# Patient Record
Sex: Female | Born: 2000 | Race: Asian | Hispanic: No | Marital: Single | Smoking: Never smoker
Health system: Southern US, Community
[De-identification: ages and names within clinical notes are randomized; demographics above are authoritative.]

## PROBLEM LIST (undated history)

## (undated) DIAGNOSIS — H905 Unspecified sensorineural hearing loss: Secondary | ICD-10-CM

## (undated) DIAGNOSIS — R7611 Nonspecific reaction to tuberculin skin test without active tuberculosis: Secondary | ICD-10-CM

## (undated) HISTORY — DX: Nonspecific reaction to tuberculin skin test without active tuberculosis: R76.11

## (undated) HISTORY — DX: Unspecified sensorineural hearing loss: H90.5

---
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
## 2018-02-12 ENCOUNTER — Encounter: Payer: Self-pay | Admitting: Obstetrics & Gynecology

## 2018-06-20 DIAGNOSIS — H5213 Myopia, bilateral: Secondary | ICD-10-CM | POA: Diagnosis not present

## 2018-09-19 ENCOUNTER — Telehealth: Payer: Self-pay

## 2018-09-19 NOTE — Telephone Encounter (Signed)
Copied from CRM [id number]. Topic: Appointment Scheduling - Scheduling Inquiry for Clinic >> Sep 19, 2018 10:55 AM Herby Abraham C wrote: Reason for CRM: current pt Toni Ryan, Jeffery would like to know if PCP Abner Greenspan would take daughter on as np?

## 2018-09-19 NOTE — Telephone Encounter (Signed)
Please advise 

## 2018-09-21 NOTE — Telephone Encounter (Signed)
Yes willing to take family member unable to take a new patient in January

## 2018-09-23 NOTE — Telephone Encounter (Signed)
Okay to schedule NP appt.  

## 2018-09-24 NOTE — Telephone Encounter (Signed)
Appt scheduled 12/01/2018.  

## 2018-12-01 ENCOUNTER — Ambulatory Visit (INDEPENDENT_AMBULATORY_CARE_PROVIDER_SITE_OTHER): Payer: No Typology Code available for payment source | Admitting: Family Medicine

## 2018-12-01 ENCOUNTER — Encounter: Payer: Self-pay | Admitting: Family Medicine

## 2018-12-01 ENCOUNTER — Other Ambulatory Visit: Payer: Self-pay

## 2018-12-01 VITALS — BP 110/74 | HR 77 | Temp 98.0°F | Resp 18 | Ht 61.0 in | Wt 158.4 lb

## 2018-12-01 DIAGNOSIS — M79672 Pain in left foot: Secondary | ICD-10-CM | POA: Diagnosis not present

## 2018-12-01 DIAGNOSIS — Z23 Encounter for immunization: Secondary | ICD-10-CM

## 2018-12-01 DIAGNOSIS — Z0000 Encounter for general adult medical examination without abnormal findings: Secondary | ICD-10-CM

## 2018-12-01 NOTE — Progress Notes (Signed)
Subjective:    Patient ID: Toni Ryan, female    DOB: 2001/02/14, 18 y.o.   MRN: [id number]  Chief Complaint  Patient presents with  . New Patient (Initial Visit)    Pt states wanting a annual check up and states doesn't need a pap smear.    HPI Patient is in today for new patient annual preventative exam. She feels well but needs an adult doctor. She is a Presenter, broadcasting at [employer]. She is noting some swelling and pain with palpation on the top of her left foot. No injury. No redness or warmth. Denies CP/palp/SOB/HA/congestion/fevers/GI or GU c/o. Taking meds as prescribed. She tries to maintain a heart helathy diet.   Past Medical History:  Diagnosis Date  . 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 No pertinent surgical history.  Family History  Problem Relation Age of Onset  . Hypertension Mother   . Cancer Maternal Grandmother   . Hyperlipidemia Maternal Grandmother   . Hyperlipidemia Paternal Grandmother   . Cancer Paternal Grandfather   . Allergies Father     Social History   Socioeconomic History  . Marital status: Single    Spouse name: Not on file  . Number of children: Not on file  . Years of education: Not on file  . Highest education level: Not on file  Occupational History  . Not on file  Social Needs  . Financial resource strain: Not on file  . Food insecurity:    Worry: Not on file    Inability: Not on file  . Transportation needs:    Medical: Not on file    Non-medical: Not on file  Tobacco Use  . Smoking status: Never Smoker  . Smokeless tobacco: Never Used  Substance and Sexual Activity  . Alcohol use: Never    Frequency: Never  . Drug use: Never  . Sexual activity: Not on file  Lifestyle  . Physical activity:    Days per week: Not on file    Minutes per session: Not on file  . Stress: Not on file  Relationships  . Social connections:    Talks on phone: Not on file    Gets together: Not on file   Attends religious service: Not on file    Active member of club or organization: Not on file    Attends meetings of clubs or organizations: Not on file    Relationship status: Not on file  . Intimate partner violence:    Fear of current or ex partner: Not on file    Emotionally abused: Not on file    Physically abused: Not on file    Forced sexual activity: Not on file  Other Topics Concern  . Not on file  Social History Narrative   Lives with parents, in school for nursing at [employer]. Works part Oncologist, no dietary restrictions, wears seat belt   Neg ETOH/tobacco/drug. 2 dogs. No dietary restrictions. Stays active    Outpatient Medications Prior to Visit  Medication Sig Dispense Refill  . Multiple Vitamin (MULTIVITAMIN) capsule Take by mouth.     No facility-administered medications prior to visit.     Not on File  Review of Systems  Constitutional: Negative for chills, fever and malaise/fatigue.  HENT: Negative for congestion and hearing loss.   Eyes: Negative for discharge.  Respiratory: Negative for cough, sputum production and shortness of breath.   Cardiovascular: Negative for chest pain, palpitations and leg  swelling.  Gastrointestinal: Negative for abdominal pain, blood in stool, constipation, diarrhea, heartburn, nausea and vomiting.  Genitourinary: Negative for dysuria, frequency, hematuria and urgency.  Musculoskeletal: Positive for joint pain. Negative for back pain, falls and myalgias.  Skin: Negative for rash.  Neurological: Negative for dizziness, sensory change, loss of consciousness, weakness and headaches.  Endo/Heme/Allergies: Negative for environmental allergies. Does not bruise/bleed easily.  Psychiatric/Behavioral: Negative for depression and suicidal ideas. The patient is not nervous/anxious and does not have insomnia.        Objective:    Physical Exam Constitutional:      General: She is not in acute distress.    Appearance: She is not  diaphoretic.  HENT:     Head: Normocephalic and atraumatic.     Right Ear: External ear normal.     Left Ear: External ear normal.     Nose: Nose normal.     Mouth/Throat:     Pharynx: No oropharyngeal exudate.  Eyes:     General: No scleral icterus.       Right eye: No discharge.        Left eye: No discharge.     Conjunctiva/sclera: Conjunctivae normal.     Pupils: Pupils are equal, round, and reactive to light.  Neck:     Musculoskeletal: Normal range of motion and neck supple.     Thyroid: No thyromegaly.  Cardiovascular:     Rate and Rhythm: Normal rate and regular rhythm.     Heart sounds: Normal heart sounds. No murmur.  Pulmonary:     Effort: Pulmonary effort is normal. No respiratory distress.     Breath sounds: Normal breath sounds. No wheezing or rales.  Abdominal:     General: Bowel sounds are normal. There is no distension.     Palpations: Abdomen is soft. There is no mass.     Tenderness: There is no abdominal tenderness.  Musculoskeletal: Normal range of motion.        General: No tenderness.  Lymphadenopathy:     Cervical: No cervical adenopathy.  Skin:    General: Skin is warm and dry.     Findings: No rash.  Neurological:     Mental Status: She is alert and oriented to person, place, and time.     Cranial Nerves: No cranial nerve deficit.     Coordination: Coordination normal.     Deep Tendon Reflexes: Reflexes are normal and symmetric. Reflexes normal.     BP 110/74 (BP Location: Left Arm, Patient Position: Sitting, Cuff Size: Normal)   Pulse 77   Temp 98 F (36.7 C) (Oral)   Resp 18   Ht '5\' 1"'$  (1.549 m)   Wt 158 lb 6.4 oz (71.8 kg)   LMP 11/03/2018   SpO2 98%   BMI 29.93 kg/m  Wt Readings from Last 3 Encounters:  12/01/18 158 lb 6.4 oz (71.8 kg) (86 %, Z= 1.09)*   * Growth percentiles are based on CDC (Girls, 2-20 Years) data.     No results found for: WBC, HGB, HCT, PLT, GLUCOSE, CHOL, TRIG, HDL, LDLDIRECT, LDLCALC, ALT, AST, NA, K, CL,  CREATININE, BUN, CO2, TSH, PSA, INR, GLUF, HGBA1C, MICROALBUR  No results found for: TSH No results found for: WBC, HGB, HCT, MCV, PLT No results found for: NA, K, CHLORIDE, CO2, GLUCOSE, BUN, CREATININE, BILITOT, ALKPHOS, AST, ALT, PROT, ALBUMIN, CALCIUM, ANIONGAP, EGFR, GFR No results found for: CHOL No results found for: HDL No results found for: LDLCALC No results  found for: TRIG No results found for: CHOLHDL No results found for: HGBA1C     Assessment & Plan:   Problem List Items Addressed This Visit    Preventative health care    Patient encouraged to maintain heart healthy diet, regular exercise, adequate sleep. Consider daily probiotics. Take medications as prescribed      Foot pain, left    Probably a ganglion cyst on left foot. Encouraged massage with Lidocaine topically. Xray ordered      Relevant Orders   DG Foot Complete Left    Other Visit Diagnoses    Need for influenza vaccination    -  Primary   Relevant Orders   Flu Vaccine QUAD 6+ mos PF IM (Fluarix Quad PF) (Completed)      I am having Toni Ryan maintain her multivitamin.  No orders of the defined types were placed in this encounter.    Penni Homans, MD

## 2018-12-01 NOTE — Assessment & Plan Note (Signed)
Probably a ganglion cyst on left foot. Encouraged massage with Lidocaine topically. Xray ordered

## 2018-12-01 NOTE — Patient Instructions (Addendum)
Lidocaine gel to foot daily  Preventive Care 18-39 Years, Female Preventive care refers to lifestyle choices and visits with your health care provider that can promote health and wellness. What does preventive care include?   A yearly physical exam. This is also called an annual well check.  Dental exams once or twice a year.  Routine eye exams. Ask your health care provider how often you should have your eyes checked.  Personal lifestyle choices, including: ? Daily care of your teeth and gums. ? Regular physical activity. ? Eating a healthy diet. ? Avoiding tobacco and drug use. ? Limiting alcohol use. ? Practicing safe sex. ? Taking vitamin and mineral supplements as recommended by your health care provider. What happens during an annual well check? The services and screenings done by your health care provider during your annual well check will depend on your age, overall health, lifestyle risk factors, and family history of disease. Counseling Your health care provider may ask you questions about your:  Alcohol use.  Tobacco use.  Drug use.  Emotional well-being.  Home and relationship well-being.  Sexual activity.  Eating habits.  Work and work Statistician.  Method of birth control.  Menstrual cycle.  Pregnancy history. Screening You may have the following tests or measurements:  Height, weight, and BMI.  Diabetes screening. This is done by checking your blood sugar (glucose) after you have not eaten for a while (fasting).  Blood pressure.  Lipid and cholesterol levels. These may be checked every 5 years starting at age 69.  Skin check.  Hepatitis C blood test.  Hepatitis B blood test.  Sexually transmitted disease (STD) testing.  BRCA-related cancer screening. This may be done if you have a family history of breast, ovarian, tubal, or peritoneal cancers.  Pelvic exam and Pap test. This may be done every 3 years starting at age 51. Starting at  age 22, this may be done every 5 years if you have a Pap test in combination with an HPV test. Discuss your test results, treatment options, and if necessary, the need for more tests with your health care provider. Vaccines Your health care provider may recommend certain vaccines, such as:  Influenza vaccine. This is recommended every year.  Tetanus, diphtheria, and acellular pertussis (Tdap, Td) vaccine. You may need a Td booster every 10 years.  Varicella vaccine. You may need this if you have not been vaccinated.  HPV vaccine. If you are 2 or younger, you may need three doses over 6 months.  Measles, mumps, and rubella (MMR) vaccine. You may need at least one dose of MMR. You may also need a second dose.  Pneumococcal 13-valent conjugate (PCV13) vaccine. You may need this if you have certain conditions and were not previously vaccinated.  Pneumococcal polysaccharide (PPSV23) vaccine. You may need one or two doses if you smoke cigarettes or if you have certain conditions.  Meningococcal vaccine. One dose is recommended if you are age 29-21 years and a first-year college student living in a residence hall, or if you have one of several medical conditions. You may also need additional booster doses.  Hepatitis A vaccine. You may need this if you have certain conditions or if you travel or work in places where you may be exposed to hepatitis A.  Hepatitis B vaccine. You may need this if you have certain conditions or if you travel or work in places where you may be exposed to hepatitis B.  Haemophilus influenzae type b (Hib) vaccine.  You may need this if you have certain risk factors. Talk to your health care provider about which screenings and vaccines you need and how often you need them. This information is not intended to replace advice given to you by your health care provider. Make sure you discuss any questions you have with your health care provider. Document Released: 10/30/2001  Document Revised: 04/16/2017 Document Reviewed: 07/05/2015 Elsevier Interactive Patient Education  2019 [employer].

## 2018-12-01 NOTE — Assessment & Plan Note (Signed)
Patient encouraged to maintain heart healthy diet, regular exercise, adequate sleep. Consider daily probiotics. Take medications as prescribed 

## 2019-11-16 ENCOUNTER — Ambulatory Visit: Payer: No Typology Code available for payment source | Attending: Internal Medicine

## 2019-11-16 DIAGNOSIS — Z20822 Contact with and (suspected) exposure to covid-19: Secondary | ICD-10-CM

## 2019-11-18 LAB — NOVEL CORONAVIRUS, NAA: SARS-CoV-2, NAA: NOT DETECTED

## 2020-07-01 ENCOUNTER — Other Ambulatory Visit (HOSPITAL_BASED_OUTPATIENT_CLINIC_OR_DEPARTMENT_OTHER): Payer: Self-pay | Admitting: Internal Medicine

## 2020-09-23 ENCOUNTER — Ambulatory Visit: Payer: Self-pay | Attending: Internal Medicine

## 2020-09-23 ENCOUNTER — Other Ambulatory Visit (HOSPITAL_BASED_OUTPATIENT_CLINIC_OR_DEPARTMENT_OTHER): Payer: Self-pay | Admitting: Internal Medicine

## 2020-09-23 DIAGNOSIS — Z23 Encounter for immunization: Secondary | ICD-10-CM

## 2020-09-23 NOTE — Progress Notes (Signed)
   Covid-19 Vaccination Clinic  Name:  Toni Ryan    MRN: [id number] DOB: [birth year]  09/23/2020  Toni Ryan was observed post Covid-19 immunization for 15 minutes without incident. She was provided with Vaccine Information Sheet and instruction to access the V-Safe system.   Toni Ryan was instructed to call 911 with any severe reactions post vaccine: Marland Kitchen Difficulty breathing  . Swelling of face and throat  . A fast heartbeat  . A bad rash all over body  . Dizziness and weakness   Immunizations Administered    Name Date Dose VIS Date Route   Moderna Covid-19 Booster Vaccine 09/23/2020 10:19 AM 0.25 mL 07/06/2020 Intramuscular   Manufacturer: Gala Murdoch   Lot: [id number]   NDC: 32992-426-83

## 2021-07-25 ENCOUNTER — Ambulatory Visit
Admission: RE | Admit: 2021-07-25 | Discharge: 2021-07-25 | Disposition: A | Discharge status: Home / Self Care | Payer: 59 | Source: Ambulatory Visit | Attending: Emergency Medicine | Admitting: Emergency Medicine

## 2021-07-25 ENCOUNTER — Other Ambulatory Visit: Payer: Self-pay

## 2021-07-25 VITALS — BP 119/76 | HR 92 | Temp 98.5°F | Resp 18 | Wt 154.0 lb

## 2021-07-25 DIAGNOSIS — J101 Influenza due to other identified influenza virus with other respiratory manifestations: Secondary | ICD-10-CM | POA: Diagnosis not present

## 2021-07-25 DIAGNOSIS — J069 Acute upper respiratory infection, unspecified: Secondary | ICD-10-CM | POA: Diagnosis not present

## 2021-07-25 DIAGNOSIS — J039 Acute tonsillitis, unspecified: Secondary | ICD-10-CM | POA: Diagnosis not present

## 2021-07-25 DIAGNOSIS — R059 Cough, unspecified: Secondary | ICD-10-CM | POA: Diagnosis not present

## 2021-07-25 DIAGNOSIS — J029 Acute pharyngitis, unspecified: Secondary | ICD-10-CM | POA: Diagnosis not present

## 2021-07-25 NOTE — ED Provider Notes (Signed)
[facility]    CSN: [id number] Arrival date & time: 07/25/21  1235      History   Chief Complaint Chief Complaint  Patient presents with   Cough    HPI Toni Ryan is a 20 y.o. female.   Pt states she has had cough and sore throat and ear pressure since last Wednesday. States her little sister was dx with flu about a week ago, came home to visit from school and has since gone back to school now feeling much better.  Patient states she is unaware of whether or not she has been having a fever.  Patient states that she is having pain with swallowing and does have a globus sensation when she does so.  Patient states has not tried any medications for symptoms at this time.  States she is really here because her mother told her to come in to be evaluated.  Patient states she feels like she is actually getting better at this point.  The history is provided by the patient.   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 No pertinent surgical history.  OB History   No obstetric history on file.      Home Medications    Prior to Admission medications   Medication Sig Start Date End Date Taking? Authorizing Provider  COVID-19 mRNA vaccine, Moderna, 100 MCG/0.5ML injection INJECT AS DIRECTED 09/23/20 09/23/21  Judyann Munson, MD  Multiple Vitamin (MULTIVITAMIN) capsule Take by mouth.    [provider]    Family History Family History  Problem Relation Age of Onset   Hypertension Mother    Cancer Maternal Grandmother    Hyperlipidemia Maternal Grandmother    Hyperlipidemia Paternal Grandmother    Cancer Paternal Grandfather    Allergies Father     Social History Social History   Tobacco Use   Smoking status: Never   Smokeless tobacco: Never  Vaping Use    Vaping Use: Never used  Substance Use Topics   Alcohol use: Never   Drug use: Never     Allergies   Patient has no allergy information on record.   Review of Systems Review of Systems Pertinent findings noted in history of present illness.    Physical Exam Triage Vital Signs ED Triage Vitals  Enc Vitals Group     BP 07/14/21 0827 (!) 147/82     Pulse Rate 07/14/21 0827 72     Resp 07/14/21 0827 18     Temp 07/14/21 0827 98.3 F (36.8 C)     Temp Source 07/14/21 0827 Oral     SpO2 07/14/21 0827 98 %     Weight --      Height --      Head Circumference --      Peak Flow --      Pain Score 07/14/21 0826 5     Pain Loc --      Pain Edu? --      Excl. in GC? --    No data found.  Updated Vital Signs BP 119/76 (BP Location: Right Arm)   Pulse 92   Temp 98.5 F (36.9 C) (Oral)   Resp 18   Wt 154 lb (69.9 kg)   LMP 07/12/2021 (Approximate)   SpO2  98%   BMI 29.10 kg/m   Visual Acuity Right Eye Distance:   Left Eye Distance:   Bilateral Distance:    Right Eye Near:   Left Eye Near:    Bilateral Near:     Physical Exam Vitals and nursing note reviewed.  Constitutional:      General: She is not in acute distress.    Appearance: Normal appearance. She is not ill-appearing.  HENT:     Head: Normocephalic and atraumatic.     Salivary Glands: Right salivary gland is not diffusely enlarged or tender. Left salivary gland is not diffusely enlarged or tender.     Right Ear: External ear normal. No drainage. A middle ear effusion is present. There is no impacted cerumen. Tympanic membrane is bulging. Tympanic membrane is not erythematous.     Left Ear: External ear normal. No drainage. A middle ear effusion is present. There is no impacted cerumen. Tympanic membrane is bulging. Tympanic membrane is not erythematous.     Ears:     Comments: Both TMs bulging with serous fluid, both EACs are erythematous    Nose: Mucosal edema, congestion and rhinorrhea present. No  nasal deformity or septal deviation. Rhinorrhea is clear.     Right Turbinates: Not enlarged, swollen or pale.     Left Turbinates: Not enlarged, swollen or pale.     Right Sinus: No maxillary sinus tenderness or frontal sinus tenderness.     Left Sinus: No maxillary sinus tenderness or frontal sinus tenderness.     Mouth/Throat:     Lips: Pink. No lesions.     Mouth: Mucous membranes are moist. No oral lesions.     Pharynx: Oropharynx is clear. Uvula midline. Posterior oropharyngeal erythema and uvula swelling present.     Tonsils: No tonsillar exudate. 2+ on the right. 2+ on the left.  Eyes:     General: Lids are normal.        Right eye: No discharge.        Left eye: No discharge.     Extraocular Movements: Extraocular movements intact.     Conjunctiva/sclera: Conjunctivae normal.     Right eye: Right conjunctiva is not injected.     Left eye: Left conjunctiva is not injected.  Neck:     Trachea: Trachea and phonation normal.  Cardiovascular:     Rate and Rhythm: Normal rate and regular rhythm.     Pulses: Normal pulses.     Heart sounds: Normal heart sounds. No murmur heard.   No friction rub. No gallop.  Pulmonary:     Effort: Pulmonary effort is normal. No accessory muscle usage, prolonged expiration or respiratory distress.     Breath sounds: Normal breath sounds. No stridor, decreased air movement or transmitted upper airway sounds. No decreased breath sounds, wheezing, rhonchi or rales.  Chest:     Chest wall: No tenderness.  Musculoskeletal:        General: Normal range of motion.     Cervical back: Normal range of motion and neck supple. Normal range of motion.  Lymphadenopathy:     Cervical: No cervical adenopathy.     Right cervical: No superficial, deep or posterior cervical adenopathy.    Left cervical: No superficial, deep or posterior cervical adenopathy.  Skin:    General: Skin is warm and dry.     Findings: No erythema or rash.  Neurological:     General:  No focal deficit present.     Mental Status: She  is alert and oriented to person, place, and time.  Psychiatric:        Mood and Affect: Mood normal.        Behavior: Behavior normal.     UC Treatments / Results  Labs (all labs ordered are listed, but only abnormal results are displayed) Labs Reviewed  COVID-19, FLU A+B NAA  CULTURE, GROUP A STREP [facility])  POCT RAPID STREP A (OFFICE)    EKG   Radiology No results found.  Procedures Procedures (including critical care time)  Medications Ordered in UC Medications - No data to display  Initial Impression / Assessment and Plan / UC Course  I have reviewed the triage vital signs and the nursing notes.  Pertinent labs & imaging results that were available during my care of the patient were reviewed by me and considered in my medical decision making (see chart for details).     Patient has significantly enlarged tonsils that necessitate screening for streptococcal pharyngitis.  Strep test today is negative, throat culture will be sent per protocol.  Patient advised she will be notified of the results once received, if antibiotics are needed this will be provided for her as well.  Conservative care is recommended at this time.  Patient provided with notes to return back to school and work.  Patient verbalized understanding and agreement of plan as discussed.  All questions were addressed during visit.  Please see discharge instructions below for further details of plan.  Final Clinical Impressions(s) / UC Diagnoses   Final diagnoses:  Cough, unspecified type  Acute pharyngitis, unspecified etiology  Viral upper respiratory illness  Influenza A  Acute tonsillitis, unspecified etiology     Discharge Instructions      As we discussed, based on your presentation today and her history as well as your exposure to your sister, it is presumed that you have been dealing with influenza A for the past several days.  Because your  tonsils were significantly enlarged and you are having some discomfort with swallowing this along to your illness, I feel is appropriate to test you for rapid strep just to rule this out.  If the strep test in the office is negative today, throat culture will be performed per protocol.  You should be contacted with those results in the next 3 to 5 days.  If by any chance the result of your throat culture is positive, you will be provided with a prescription for antibiotics to treat this.     ED Prescriptions   None    PDMP not reviewed this encounter.   Disposition Upon Discharge:   The patient will follow up with their current PCP if and as advised. If the patient does not currently have a PCP we will assist them in obtaining one.   Return to the [facility] or PCP in 3-5 days if no better; to PCP or the Emergency Department if new signs and symptoms develop, or if the current signs or symptoms continue to change or worsen for further workup, evaluation and treatment as clinically indicated and appropriate  Condition: stable for discharge home Home: take medications as prescribed; routine discharge instructions as discussed; follow up as advised.    Theadora Rama Scales, PA-C 07/25/21 1513

## 2021-07-25 NOTE — ED Triage Notes (Signed)
Pt states she has had cough and sore throat and ear pressure since last Wednesday. States her little sister was dx with flu. Not taking and meds and no fevers

## 2021-07-25 NOTE — Discharge Instructions (Addendum)
As we discussed, based on your presentation today and her history as well as your exposure to your sister, it is presumed that you have been dealing with influenza A for the past several days.  Because your tonsils were significantly enlarged and you are having some discomfort with swallowing this along to your illness, I feel is appropriate to test you for rapid strep just to rule this out.  If the strep test in the office is negative today, throat culture will be performed per protocol.  You should be contacted with those results in the next 3 to 5 days.  If by any chance the result of your throat culture is positive, you will be provided with a prescription for antibiotics to treat this.

## 2021-07-25 NOTE — ED Notes (Signed)
Pt did not want to wait for rapid step test. Ok per lindsey morgan PA to send culture.

## 2021-07-27 LAB — COVID-19, FLU A+B NAA
Influenza A, NAA: DETECTED — AB
Influenza B, NAA: NOT DETECTED
SARS-CoV-2, NAA: NOT DETECTED

## 2021-07-28 LAB — CULTURE, GROUP A STREP (THRC)

## 2022-05-13 ENCOUNTER — Ambulatory Visit (HOSPITAL_COMMUNITY)
Admission: EM | Admit: 2022-05-13 | Discharge: 2022-05-13 | Disposition: A | Discharge status: Home / Self Care | Payer: 59 | Attending: Emergency Medicine | Admitting: Emergency Medicine

## 2022-05-13 ENCOUNTER — Encounter (HOSPITAL_COMMUNITY): Payer: Self-pay

## 2022-05-13 DIAGNOSIS — M542 Cervicalgia: Secondary | ICD-10-CM

## 2022-05-13 MED ORDER — IBUPROFEN 800 MG PO TABS
800.0000 mg | ORAL_TABLET | Freq: Once | ORAL | Status: AC
  Administered 2022-05-13: 800 mg via ORAL

## 2022-05-13 MED ORDER — CYCLOBENZAPRINE HCL 10 MG PO TABS
10.0000 mg | ORAL_TABLET | Freq: Two times a day (BID) | ORAL | 0 refills | Status: AC | PRN
  Filled 2022-05-13: qty 6, 3d supply, fill #0

## 2022-05-13 MED ORDER — IBUPROFEN 800 MG PO TABS
ORAL_TABLET | ORAL | Status: AC
  Filled 2022-05-13: qty 1

## 2022-05-13 NOTE — Discharge Instructions (Addendum)
Continue ibuprofen 800 mg every 6 hours to help with inflammation and pain.  You can take the muscle relaxer twice daily unless it makes you drowsy, then only take at nighttime.  Try hot pad to the area with gentle massage and some light stretching.  I have attached some neck exercises for you to try.  If symptoms persist he can follow-up with the orthopedic specialiss.
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
## 2022-05-14 ENCOUNTER — Other Ambulatory Visit (HOSPITAL_BASED_OUTPATIENT_CLINIC_OR_DEPARTMENT_OTHER): Payer: Self-pay

## 2023-08-20 ENCOUNTER — Ambulatory Visit: Payer: Commercial Managed Care - PPO | Admitting: Family

## 2023-11-22 ENCOUNTER — Ambulatory Visit: Payer: Commercial Managed Care - PPO | Admitting: Family

## 2023-11-22 ENCOUNTER — Encounter: Payer: Self-pay | Admitting: Family

## 2023-11-22 VITALS — BP 127/88 | HR 91 | Temp 98.9°F | Resp 16 | Ht 60.5 in | Wt 167.0 lb

## 2023-11-22 DIAGNOSIS — Z0000 Encounter for general adult medical examination without abnormal findings: Secondary | ICD-10-CM

## 2023-11-22 DIAGNOSIS — Z23 Encounter for immunization: Secondary | ICD-10-CM

## 2023-11-22 DIAGNOSIS — Z1159 Encounter for screening for other viral diseases: Secondary | ICD-10-CM

## 2023-11-22 DIAGNOSIS — Z114 Encounter for screening for human immunodeficiency virus [HIV]: Secondary | ICD-10-CM

## 2023-11-22 DIAGNOSIS — H903 Sensorineural hearing loss, bilateral: Secondary | ICD-10-CM | POA: Diagnosis not present

## 2023-11-22 NOTE — Assessment & Plan Note (Addendum)
 Tdap and Flu shot today. Discussed healthy diet, exercise. Requests GYN referral fo Pap.  Baseline labs as ordered. She understands that these may not be covered by insurance.

## 2023-11-22 NOTE — Assessment & Plan Note (Signed)
 Since childhood. She is considering hearing aids.  Will refer to ENT for further evaluation.

## 2023-11-22 NOTE — Patient Instructions (Signed)
 VISIT SUMMARY:  You came in today for your annual physical exam. We discussed your overall health, including your history of childhood hearing loss and your interest in hearing aids. We also reviewed your family history and lifestyle habits, and I provided recommendations for general health maintenance.  YOUR PLAN:  -SENSORINEURAL HEARING LOSS: Sensorineural hearing loss is a type of hearing loss that occurs due to damage to the inner ear or the nerve pathways from the inner ear to the brain. You have had this condition since childhood and are now interested in exploring hearing aids. I have referred you to [facility] for a thorough evaluation and potential hearing aid fitting.  -GENERAL HEALTH MAINTENANCE: We discussed the importance of regular health screenings and vaccinations. I recommend starting Pap smears at age 55, and you should get tetanus and flu vaccines. I have also ordered baseline lab work, including glucose and lipid panel, and offered one-time HIV and Hepatitis C screening. Additionally, I advised you to maintain a healthy diet, exercise regularly, and ensure you get adequate rest.  INSTRUCTIONS:  Please follow up with [facility]NT for your hearing evaluation and potential hearing aid fitting. Schedule an appointment with gynecology for your Pap smear. Make sure to get your tetanus and flu vaccines. Complete the lab work as ordered, and consider the one-time HIV and Hepatitis C screening. Continue to maintain a healthy lifestyle with a balanced diet, regular exercise, and sufficient rest.

## 2023-11-22 NOTE — Addendum Note (Signed)
 Addended by: Wilford Corner on: 11/22/2023 02:03 PM   Modules accepted: Orders

## 2023-11-22 NOTE — Progress Notes (Signed)
 Subjective:     Patient ID: Toni Ryan, female    DOB: 2000-12-07, 23 y.o.   MRN: [id number]  Chief Complaint  Patient presents with   New Patient (Initial Visit)    HPI  Discussed the use of AI scribe software for clinical note transcription with the patient, who gave verbal consent to proceed.  History of Present Illness   The patient presents today to establish care and for an annual physical exam.  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  She has a history of childhood sensorineural hearing loss. She has not used hearing aids in the past but is now interested in them as she feels her hearing might be becoming an issue. She has not noticed any significant changes in her hearing since childhood.  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 She has no current symptoms or issues related to tuberculosis.  Her family history includes a mother with high blood pressure, a father with allergies, and a maternal grandmother who possibly had thyroid cancer. Her maternal grandfather had liver cancer, and her paternal grandmother had high cholesterol. Her paternal grandfather was a smoker and possibly had lung cancer.  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 She enjoys hiking, painting, and playing lacrosse for fun. She reports very rare alcohol use, no drug use, and is not currently sexually active. She does not use tobacco or vape.  During the review of systems, she reports stable weight, no current cough or cold symptoms, normal vision, and no skin concerns. No swelling in her legs, digestive problems, urinary symptoms, muscle or joint pain, headaches, or concerns about depression or anxiety. She is up to date on  dental and vision exams and plans to see her ophthalmologist this summer.  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 thyroid cancer   Hyperlipidemia Maternal Grandmother    Liver cancer Maternal Grandfather    Hyperlipidemia Paternal Grandmother    Cancer Paternal Grandfather        ?lung cancer, smoker    Social History   Socioeconomic History   Marital status: Single    Spouse name: Not on file   Number of children: Not on file   Years of education: Not on file   Highest education level: Bachelor's degree (e.g., BA, AB, BS)  Occupational History   Not on file  Tobacco Use   Smoking status: Never   Smokeless tobacco:  Never  Vaping Use   Vaping status: Never Used  Substance and Sexual Activity   Alcohol use: Yes    Comment: twice a year   Drug use: Never   Sexual activity: Never  Other Topics Concern   Not on file  Social History Narrative   Lives with parents, in school for nursing at [employer].    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    2 dogs.    No dietary restrictions.    Stays active   Enjoys hiking, painting, lacross   Social Drivers of Health   Financial Resource Strain: Low Risk  (11/21/2023)   Overall Financial Resource Strain  (CARDIA)    Difficulty of Paying Living Expenses: Not hard at all  Food Insecurity: No Food Insecurity (11/21/2023)   Hunger Vital Sign    Worried About Running Out of Food in the Last Year: Never true    Ran Out of Food in the Last Year: Never true  Transportation Needs: No Transportation Needs (11/21/2023)   PRAPARE - Administrator, Civil Service (Medical): No    Lack of Transportation (Non-Medical): No  Physical Activity: Insufficiently Active (11/21/2023)   Exercise Vital Sign    Days of Exercise per Week: 2 days    Minutes of Exercise per Session: 30 min  Stress: No Stress Concern Present (11/21/2023)   [employer] of Occupational Health - Occupational Stress Questionnaire    Feeling of Stress : Only a little  Social Connections: Moderately Isolated (11/21/2023)   Social Connection and Isolation Panel [NHANES]    Frequency of Communication with Friends and Family: Three times a week    Frequency of Social Gatherings with Friends and Family: Once a week    Attends Religious Services: More than 4 times per year    Active Member of [employer] or Organizations: No    Attends Engineer, structural: Not on file    Marital Status: Never married  Intimate Partner Violence: Not on file    Outpatient Medications Prior to Visit  Medication Sig Dispense Refill   Multiple Vitamin (MULTIVITAMIN) capsule Take by mouth.     No facility-administered medications prior to visit.    Not on File  Review of Systems  Constitutional:  Negative for weight loss.  HENT:  Positive for hearing loss. Negative for congestion.   Eyes:  Negative for blurred vision.  Respiratory:  Negative for cough.   Cardiovascular:  Negative for leg swelling.  Gastrointestinal:  Negative for constipation and diarrhea.  Genitourinary:  Negative for dysuria and frequency.  Musculoskeletal:  Negative for joint pain and myalgias.  Skin:  Negative for rash.  Neurological:  Negative for headaches.   Psychiatric/Behavioral:  Negative for depression. The patient is not nervous/anxious.        Objective:    Physical Exam   BP 127/88 (BP Location: Right Arm, Patient Position: Sitting, Cuff Size: Normal)   Pulse 91   Temp 98.9 F (37.2 C) (Oral)   Resp 16   Ht 5' 0.5" (1.537 m)   Wt 167 lb (75.8 kg)   SpO2 99%   BMI 32.08 kg/m  Wt Readings from Last 3 Encounters:  11/22/23 167 lb (75.8 kg)  05/13/22 168 lb (76.2 kg)  07/25/21 154 lb (69.9 kg)   Physical Exam  Constitutional: She is oriented to person, place, and time. She appears well-developed and well-nourished. No  distress.  HENT:  Head: Normocephalic and atraumatic.  Right Ear: Tympanic membrane and ear canal normal.  Left Ear: Tympanic membrane and ear canal normal.  Mouth/Throat: Oropharynx is clear and moist.  Eyes: Pupils are equal, round, and reactive to light. No scleral icterus.  Neck: Normal range of motion. No thyromegaly present.  Cardiovascular: Normal rate and regular rhythm.   No murmur heard. Pulmonary/Chest: Effort normal and breath sounds normal. No respiratory distress. He has no wheezes. She has no rales. She exhibits no tenderness.  Abdominal: Soft. Bowel sounds are normal. She exhibits no distension and no mass. There is no tenderness. There is no rebound and no guarding.  Musculoskeletal: She exhibits no edema.  Lymphadenopathy:    She has no cervical adenopathy.  Neurological: She is alert and oriented to person, place, and time. She has normal patellar reflexes. She exhibits normal muscle tone. Coordination normal.  Skin: Skin is warm and dry.  Psychiatric: She has a normal mood and affect. Her behavior is normal. Judgment and thought content normal.  Breasts: Examined lying       Assessment & Plan:       Assessment & Plan:   Problem List Items Addressed This Visit       Unprioritized   Sensorineural hearing loss (SNHL) of both ears   Since childhood. She is considering hearing  aids.  Will refer to ENT for further evaluation.       Relevant Orders   Ambulatory referral to ENT   Preventative health care - Primary   Tdap and Flu shot today. Discussed healthy diet, exercise. Requests GYN referral fo Pap.  Baseline labs as ordered. She understands that these may not be covered by insurance.      Relevant Orders   Ambulatory referral to Obstetrics / Gynecology   Comp Met (CMET)   Lipid panel   TSH   Other Visit Diagnoses       Encounter for screening for HIV         Encounter for hepatitis C screening test for low risk patient       Relevant Orders   HIV antibody (with reflex)   Hepatitis C Antibody       I am having Jillian Ramond Marrow "Jillian" maintain her multivitamin.  No orders of the defined types were placed in this encounter.

## 2023-11-23 LAB — COMPREHENSIVE METABOLIC PANEL
AG Ratio: 1.3 (calc) (ref 1.0–2.5)
ALT: 22 U/L (ref 6–29)
AST: 22 U/L (ref 10–30)
Albumin: 4.5 g/dL (ref 3.6–5.1)
Alkaline phosphatase (APISO): 89 U/L (ref 31–125)
BUN: 10 mg/dL (ref 7–25)
CO2: 25 mmol/L (ref 20–32)
Calcium: 9.7 mg/dL (ref 8.6–10.2)
Chloride: 102 mmol/L (ref 98–110)
Creat: 0.64 mg/dL (ref 0.50–0.96)
Globulin: 3.4 g/dL (ref 1.9–3.7)
Glucose, Bld: 80 mg/dL (ref 65–99)
Potassium: 3.9 mmol/L (ref 3.5–5.3)
Sodium: 139 mmol/L (ref 135–146)
Total Bilirubin: 0.6 mg/dL (ref 0.2–1.2)
Total Protein: 7.9 g/dL (ref 6.1–8.1)

## 2023-11-23 LAB — LIPID PANEL
Cholesterol: 153 mg/dL (ref <200)
HDL: 55 mg/dL (ref >=50)
LDL Cholesterol (Calc): 77 mg/dL
Non-HDL Cholesterol (Calc): 98 mg/dL (ref <130)
Total CHOL/HDL Ratio: 2.8 (calc) (ref <5.0)
Triglycerides: 120 mg/dL (ref <150)

## 2023-11-23 LAB — HIV ANTIBODY (ROUTINE TESTING W REFLEX): HIV 1&2 Ab, 4th Generation: NONREACTIVE

## 2023-11-23 LAB — TSH: TSH: 1.41 m[IU]/L

## 2023-11-23 LAB — HEPATITIS C ANTIBODY: Hepatitis C Ab: NONREACTIVE

## 2023-11-25 ENCOUNTER — Encounter: Payer: Self-pay | Admitting: Family

## 2024-06-04 ENCOUNTER — Telehealth: Payer: Self-pay

## 2024-06-04 NOTE — Telephone Encounter (Signed)
 Called patient to schedule new patient appointment. Left voicemail with our contact information to call back and schedule.

## 2024-07-17 ENCOUNTER — Other Ambulatory Visit (HOSPITAL_COMMUNITY): Payer: Self-pay

## 2024-07-17 ENCOUNTER — Other Ambulatory Visit (HOSPITAL_BASED_OUTPATIENT_CLINIC_OR_DEPARTMENT_OTHER): Payer: Self-pay

## 2024-07-17 MED ORDER — FLUZONE 0.5 ML IM SUSY
0.5000 mL | PREFILLED_SYRINGE | Freq: Once | INTRAMUSCULAR | 0 refills | Status: AC
  Filled 2024-07-17: qty 0.5, 1d supply, fill #0

## 2024-07-28 ENCOUNTER — Encounter: Admitting: Obstetrics and Gynecology

## 2024-09-21 ENCOUNTER — Encounter: Admitting: Obstetrics and Gynecology

## 2024-10-09 ENCOUNTER — Ambulatory Visit: Admitting: Obstetrics and Gynecology

## 2024-10-09 ENCOUNTER — Encounter: Payer: Self-pay | Admitting: Obstetrics and Gynecology

## 2024-10-09 ENCOUNTER — Other Ambulatory Visit (HOSPITAL_COMMUNITY)
Admission: RE | Admit: 2024-10-09 | Discharge: 2024-10-09 | Disposition: A | Source: Ambulatory Visit | Attending: Obstetrics and Gynecology | Admitting: Obstetrics and Gynecology

## 2024-10-09 VITALS — BP 132/77 | HR 85 | Ht 60.5 in | Wt 172.0 lb

## 2024-10-09 DIAGNOSIS — Z124 Encounter for screening for malignant neoplasm of cervix: Secondary | ICD-10-CM | POA: Insufficient documentation

## 2024-10-09 DIAGNOSIS — Z01419 Encounter for gynecological examination (general) (routine) without abnormal findings: Secondary | ICD-10-CM | POA: Diagnosis not present

## 2024-10-09 NOTE — Progress Notes (Signed)
 "  ANNUAL GYNECOLOGY VISIT Chief Complaint  Patient presents with   Gynecologic Exam     Subjective:  Toni Ryan is a 24 y.o. G0P0000 who presents for annual.  No concerns  Gyn History: Patient's last menstrual period was 09/17/2024 (exact date). Sexually active: yes/no: No Contraception: abstinence History of STIs: n/a Last pap: never had a pap History of abnormal pap: never had a pap Periods: regular, monthly  The pregnancy intention screening data noted above was reviewed. Potential methods of contraception were discussed. The patient elected to proceed with No data recorded.       10/09/2024   10:26 AM 11/22/2023    1:21 PM  Depression screen PHQ 2/9  Decreased Interest 0 0  Down, Depressed, Hopeless 0 0  PHQ - 2 Score 0 0  Altered sleeping 1 0  Tired, decreased energy 1 0  Change in appetite 0 0  Feeling bad or failure about yourself  0 0  Trouble concentrating 0 0  Moving slowly or fidgety/restless 0 0  Suicidal thoughts 0 0  PHQ-9 Score 2 0   Difficult doing work/chores  Not difficult at all     Data saved with a previous flowsheet row definition        10/09/2024   10:26 AM 11/22/2023    1:21 PM  GAD 7 : Generalized Anxiety Score  Nervous, Anxious, on Edge 0 0   Control/stop worrying 0 0   Worry too much - different things 1 0   Trouble relaxing 0 0   Restless 0 0   Easily annoyed or irritable 0 0   Afraid - awful might happen 1 0   Total GAD 7 Score 2 0  Anxiety Difficulty  Not difficult at all     Data saved with a previous flowsheet row definition      OB History     Gravida  0   Para  0   Term  0   Preterm  0   AB  0   Living  0      SAB  0   IAB  0   Ectopic  0   Multiple  0   Live Births  0           Past Medical History:  Diagnosis Date   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   X-ray was normal, completed rifampin through the health department   Sensorineural hearing loss    as a child    Past Surgical  History:  Procedure Laterality Date   WISDOM TOOTH EXTRACTION Bilateral     Social History   Socioeconomic History   Marital status: Single    Spouse name: Not on file   Number of children: Not on file   Years of education: Not on file   Highest education level: Bachelor's degree (e.g., BA, AB, BS)  Occupational History   Not on file  Tobacco Use   Smoking status: Never   Smokeless tobacco: Never  Vaping Use   Vaping status: Never Used  Substance and Sexual Activity   Alcohol use: Yes    Comment: twice a year   Drug use: Never   Sexual activity: Never  Other Topics Concern   Not on file  Social History Narrative   Lives with parents, in school for nursing at [employer].    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  2 dogs.    No dietary restrictions.    Stays active   Enjoys hiking, painting, lacross   Social Drivers of Health   Tobacco Use: Low Risk (11/22/2023)   Patient History    Smoking Tobacco Use: Never    Smokeless Tobacco Use: Never    Passive Exposure: Not on file  Financial Resource Strain: Low Risk (11/21/2023)   Overall Financial Resource Strain (CARDIA)    Difficulty of Paying Living Expenses: Not hard at all  Food Insecurity: No Food Insecurity (11/21/2023)   Hunger Vital Sign    Worried About Running Out of Food in the Last Year: Never true    Ran Out of Food in the Last Year: Never true  Transportation Needs: No Transportation Needs (11/21/2023)   PRAPARE - Administrator, Civil Service (Medical): No    Lack of Transportation (Non-Medical): No  Physical Activity: Insufficiently Active (11/21/2023)   Exercise Vital Sign    Days of Exercise per Week: 2 days    Minutes of Exercise per Session: 30 min  Stress: No Stress Concern Present (11/21/2023)   [employer] of Occupational Health - Occupational Stress Questionnaire    Feeling of Stress : Only a little   Social Connections: Moderately Isolated (11/21/2023)   Social Connection and Isolation Panel    Frequency of Communication with Friends and Family: Three times a week    Frequency of Social Gatherings with Friends and Family: Once a week    Attends Religious Services: More than 4 times per year    Active Member of Clubs or Organizations: No    Attends Engineer, Structural: Not on file    Marital Status: Never married  Depression (PHQ2-9): Low Risk (10/09/2024)   Depression (PHQ2-9)    PHQ-2 Score: 2  Alcohol Screen: Low Risk (11/21/2023)   Alcohol Screen    Last Alcohol Screening Score (AUDIT): 1  Housing: Unknown (11/21/2023)   Housing Stability Vital Sign    Unable to Pay for Housing in the Last Year: Patient declined    Number of Times Moved in the Last Year: Not on file    Homeless in the Last Year: No  Utilities: Not on file  Health Literacy: Not on file    Family History  Problem Relation Age of Onset   Hypertension Mother    Allergies Father    Cancer Maternal Grandmother        ? thyroid  cancer   Hyperlipidemia Maternal Grandmother    Liver cancer Maternal Grandfather    Hyperlipidemia Paternal Grandmother    Cancer Paternal Grandfather        ?lung cancer, smoker    Medications Ordered Prior to Encounter[1]  Allergies[2]   Objective:   Vitals:   10/09/24 1028  BP: 132/77  Pulse: 85  Weight: 172 lb (78 kg)  Height: 5' 0.5 (1.537 m)   Physical Examination:   General appearance - well appearing, and in no distress  Mental status - alert, oriented to person, place, and time  Psych:  normal mood and affect  Skin - warm and dry, normal color, no suspicious lesions noted  Breasts - breasts appear normal, no suspicious masses, no skin or nipple changes or  axillary nodes  Abdomen - soft, nontender, nondistended, no masses or organomegaly  Pelvic -  VULVA: normal appearing vulva with no masses, tenderness or lesions   VAGINA: normal appearing vagina  with normal color and discharge, no lesions   CERVIX: normal appearing  cervix without discharge or lesions, no CMT  Thin prep pap is done with reflex HR HPV cotesting  Small bleeding with exam reviewed with patient  UTERUS: uterus is felt to be normal size, shape, consistency and nontender   ADNEXA: No adnexal masses or tenderness noted.  Extremities:  No swelling or varicosities noted  Chaperone present for exam  Assessment and Plan:  1. Well woman exam with routine gynecological exam (Primary) Pap Normal clinical breast exam, reviewed self breast exams, mammograms to start at age 8 Not sexually active and declines need for contraception or STI testing at this time  2. Cervical cancer screening - Cytology - PAP    Return in about 1 year (around 10/09/2025).  No future appointments.  Rollo ONEIDA Bring, MD, FACOG Obstetrician & Gynecologist, [facility] for [facility], [facility]     [1]  Current Outpatient Medications on File Prior to Visit  Medication Sig Dispense Refill   Multiple Vitamin (MULTIVITAMIN) capsule Take by mouth.     No current facility-administered medications on file prior to visit.  [2] No Known Allergies  "

## 2024-10-13 ENCOUNTER — Ambulatory Visit: Payer: Self-pay | Admitting: Obstetrics and Gynecology

## 2024-10-13 LAB — CYTOLOGY - PAP
Adequacy: ABSENT
Diagnosis: NEGATIVE
